# Patient Record
Sex: Male | Born: 1985 | Race: Black or African American | Hispanic: No | Marital: Single | Smoking: Never smoker
Health system: Southern US, Community
[De-identification: ages and names within clinical notes are randomized; demographics above are authoritative.]

## PROBLEM LIST (undated history)

## (undated) HISTORY — PX: HERNIA REPAIR: SHX51

---

## 2019-08-15 ENCOUNTER — Other Ambulatory Visit: Payer: Self-pay

## 2019-08-15 DIAGNOSIS — Z20822 Contact with and (suspected) exposure to covid-19: Secondary | ICD-10-CM

## 2019-08-16 LAB — NOVEL CORONAVIRUS, NAA: SARS-CoV-2, NAA: NOT DETECTED

## 2020-12-02 ENCOUNTER — Emergency Department (HOSPITAL_COMMUNITY)
Admission: EM | Admit: 2020-12-02 | Discharge: 2020-12-02 | Disposition: A | Discharge status: Home / Self Care | Payer: Self-pay | Attending: Emergency Medicine | Admitting: Emergency Medicine

## 2020-12-02 ENCOUNTER — Other Ambulatory Visit: Payer: Self-pay

## 2020-12-02 ENCOUNTER — Encounter (HOSPITAL_COMMUNITY): Payer: Self-pay

## 2020-12-02 ENCOUNTER — Emergency Department (HOSPITAL_COMMUNITY): Payer: Self-pay

## 2020-12-02 DIAGNOSIS — M65272 Calcific tendinitis, left ankle and foot: Secondary | ICD-10-CM | POA: Insufficient documentation

## 2020-12-02 DIAGNOSIS — L84 Corns and callosities: Secondary | ICD-10-CM | POA: Insufficient documentation

## 2020-12-02 DIAGNOSIS — M7750 Other enthesopathy of unspecified foot: Secondary | ICD-10-CM

## 2020-12-02 DIAGNOSIS — M79675 Pain in left toe(s): Secondary | ICD-10-CM

## 2020-12-02 DIAGNOSIS — F17290 Nicotine dependence, other tobacco product, uncomplicated: Secondary | ICD-10-CM | POA: Insufficient documentation

## 2020-12-02 MED ORDER — PREDNISONE 20 MG PO TABS: 20.0000 mg | ORAL_TABLET | Freq: Two times a day (BID) | ORAL | 0 refills | Status: AC

## 2020-12-02 NOTE — ED Provider Notes (Signed)
[facility]-EMERGENCY DEPT Provider Note   CSN: [id number] Arrival date & time: 12/02/20  1010     History Chief Complaint  Patient presents with  . Toe Pain    Kenneth Stewart is a 35 y.o. male.  HPI He presents for evaluation of pain in left foot, primarily the big toe, for 2 days, without specific known trauma.  He works as a Tree surgeon, cleaning homes.  He wears boots frequently.  No chronic history of foot pain.  There are no other known modifying factors    History reviewed. No pertinent past medical history.  There are no problems to display for this patient.   Past Surgical History:  Procedure Laterality Date  . HERNIA REPAIR         Family History  Problem Relation Age of Onset  . Hypertension Father   . Diabetes Father     Social History   Tobacco Use  . Smoking status: Never Smoker  . Smokeless tobacco: Current User    Types: Snuff  Vaping Use  . Vaping Use: Never used  Substance Use Topics  . Alcohol use: Yes  . Drug use: Yes    Types: Marijuana    Home Medications Prior to Admission medications   Not on File    Allergies    Patient has no known allergies.  Review of Systems   Review of Systems  All other systems reviewed and are negative.   Physical Exam Updated Vital Signs BP (!) 148/76 (BP Location: Left Arm)   Pulse 69   Temp 98.4 F (36.9 C) (Oral)   Resp 16   Ht 6\' 5"  (1.956 m)   Wt 106.6 kg   SpO2 98%   BMI 27.87 kg/m   Physical Exam Vitals and nursing note reviewed.  Constitutional:      General: He is not in acute distress.    Appearance: He is well-developed. He is not ill-appearing, toxic-appearing or diaphoretic.  HENT:     Head: Normocephalic and atraumatic.     Right Ear: External ear normal.     Left Ear: External ear normal.     Nose: No congestion.  Eyes:     Conjunctiva/sclera: Conjunctivae normal.     Pupils: Pupils are equal, round, and reactive to light.   Neck:     Trachea: Phonation normal.  Cardiovascular:     Rate and Rhythm: Normal rate.  Pulmonary:     Effort: Pulmonary effort is normal.  Abdominal:     General: There is no distension.     Tenderness: There is no abdominal tenderness.  Musculoskeletal:     Cervical back: Normal range of motion and neck supple.     Comments: Left foot, mild tenderness localized to the first MTP region.  Area of maximal tenderness and discomfort is dorsally, extending along the first proximal phalanx.  There is minimal induration of this area but no erythema.  He resists extension of the great toe secondary to pain indicating a possible tendinopathy.  There is no tenderness of the dorsal midfoot or ankle on the left.  Skin:    General: Skin is warm and dry.     Comments: He has calluses on the dorsum of the toes of left foot, primarily second and third.  This tends to indicate tight feeling/constricting footwear.  Neurological:     Mental Status: He is alert and oriented to person, place, and time.     Cranial Nerves: No cranial  nerve deficit.     Sensory: No sensory deficit.     Motor: No abnormal muscle tone.     Coordination: Coordination normal.  Psychiatric:        Mood and Affect: Mood normal.        Behavior: Behavior normal.        Thought Content: Thought content normal.        Judgment: Judgment normal.     ED Results / Procedures / Treatments   Labs (all labs ordered are listed, but only abnormal results are displayed) Labs Reviewed - No data to display  EKG None  Radiology DG Foot Complete Left  Result Date: 12/02/2020 CLINICAL DATA:  Great toe pain and swelling x2 days EXAM: LEFT FOOT - COMPLETE 3+ VIEW COMPARISON:  None. FINDINGS: There is no evidence of fracture or dislocation. There is no evidence of arthropathy. Cortical irregularities of the tarsal bones on the dorsal midfoot, favor sequela of prior trauma versus congenital irregularity none. Calcaneal enthesophytes. Soft  tissues are unremarkable. IMPRESSION: 1. No acute fracture or dislocation. Electronically Signed   By: Maudry Mayhew MD   On: 12/02/2020 10:51    Procedures Procedures   Medications Ordered in ED Medications - No data to display  ED Course  I have reviewed the triage vital signs and the nursing notes.  Pertinent labs & imaging results that were available during my care of the patient were reviewed by me and considered in my medical decision making (see chart for details).    MDM Rules/Calculators/A&P                           Patient Vitals for the past 24 hrs:  BP Temp Temp src Pulse Resp SpO2 Height Weight  12/02/20 1018 -- -- -- -- -- -- 6\' 5"  (1.956 m) 106.6 kg  12/02/20 1017 (!) 148/76 98.4 F (36.9 C) Oral 69 16 98 % -- --    10:55 AM Reevaluation with update and discussion. After initial assessment and treatment, an updated evaluation reveals no change in clinical status, findings cussed with patient all questions were answered. 12/04/20   Medical Decision Making:  This patient is presenting for evaluation of foot pain, which does require a range of treatment options, and is a complaint that involves a moderate risk of morbidity and mortality. The differential diagnoses include occult fracture, infection, arthropathy, tendinopathy. I decided to review old records, and in summary Young middle-aged male presenting for foot and toe pain, onset without trauma.  I do not require additional historical information from anyone.   Radiologic Tests Ordered, included left foot.  I independently Visualized: Radiographic images, which show no fracture, dislocation, joint abnormality      Critical Interventions-clinical evaluation, radiography, observation and reassessment  After These Interventions, the Patient was reevaluated and was found stable for discharge.  Evaluation most consistent with dorsal tendinitis of the first MTP region.  No fracture, arthropathy, gout,  degenerative changes  CRITICAL CARE-no Performed by: Mancel Bale  Nursing Notes Reviewed/ Care Coordinated Applicable Imaging Reviewed Interpretation of Laboratory Data incorporated into ED treatment  The patient appears reasonably screened and/or stabilized for discharge and I doubt any other medical condition or other [facility] requiring further screening, evaluation, or treatment in the ED at this time prior to discharge.  Plan: Home Medications-Tylenol for pain; Home Treatments-heat to affected area, firm but none constricting footwear; return here if the recommended treatment, does not improve  the symptoms; Recommended follow up-podiatry, or foot orthopedics, for further management     Final Clinical Impression(s) / ED Diagnoses Final diagnoses:  Great toe pain, left  Tendinitis of ankle or foot    Rx / DC Orders ED Discharge Orders    None       Mancel Bale, MD 12/02/20 1106

## 2020-12-02 NOTE — Discharge Instructions (Signed)
The pain you are having in your foot and toe, is most likely tendinitis.  To help this make sure you are wearing firm footwear and a large toe box, to prevent constriction on the big toe.  Also using heat on the sore area 2 or 3 times a day can be helpful.  We are prescribing prednisone to treat inflammation which should help after a few days.  Follow-up with doctor of your choice if not better after this treatment.

## 2020-12-02 NOTE — ED Triage Notes (Signed)
Patient c/o left big toe pain and swelling x 2 days.

## 2022-01-27 ENCOUNTER — Encounter (HOSPITAL_COMMUNITY): Payer: Self-pay

## 2022-01-27 ENCOUNTER — Emergency Department (HOSPITAL_COMMUNITY)
Admission: EM | Admit: 2022-01-27 | Discharge: 2022-01-27 | Disposition: A | Discharge status: Home / Self Care | Payer: Self-pay | Attending: Emergency Medicine | Admitting: Emergency Medicine

## 2022-01-27 ENCOUNTER — Other Ambulatory Visit: Payer: Self-pay

## 2022-01-27 ENCOUNTER — Emergency Department (HOSPITAL_COMMUNITY): Payer: Self-pay

## 2022-01-27 DIAGNOSIS — R1030 Lower abdominal pain, unspecified: Secondary | ICD-10-CM | POA: Insufficient documentation

## 2022-01-27 DIAGNOSIS — X500XXA Overexertion from strenuous movement or load, initial encounter: Secondary | ICD-10-CM | POA: Insufficient documentation

## 2022-01-27 DIAGNOSIS — R1032 Left lower quadrant pain: Secondary | ICD-10-CM

## 2022-01-27 DIAGNOSIS — Y990 Civilian activity done for income or pay: Secondary | ICD-10-CM | POA: Insufficient documentation

## 2022-01-27 MED ORDER — NAPROXEN 500 MG PO TABS: 500.0000 mg | ORAL_TABLET | Freq: Two times a day (BID) | ORAL | 0 refills | Status: AC

## 2022-01-27 MED ORDER — KETOROLAC TROMETHAMINE 30 MG/ML IJ SOLN
30.0000 mg | Freq: Once | INTRAMUSCULAR | Status: AC
  Administered 2022-01-27: 30 mg via INTRAMUSCULAR
  Filled 2022-01-27: qty 1

## 2022-01-27 NOTE — ED Provider Notes (Signed)
?[facility] EMERGENCY DEPARTMENT ?Provider Note ? ? ?CSN: [id number] ?Arrival date & time: 01/27/22  0144 ? ?  ? ?History ? ?Chief Complaint  ?Patient presents with  ? Leg Pain  ? ? ?Kenneth Stewart is a 36 y.o. male. ? ?HPI ? ?  ? ?This is a 36 year old male who presents with left groin pain.  Patient reports onset of symptoms after being at work several days ago.  He states he did do some heavy lifting.  He has a history of right right inguinal hernia requiring repair and states that this feels similar.  Pain is mostly in the left groin and it radiates down into his thigh.  He has not noted any masses or bulges.  No scrotal changes.  Denies any nausea or vomiting.  Does report some abdominal pain when he sits up quickly. ? ?Home Medications ?Prior to Admission medications   ?Medication Sig Start Date End Date Taking? Authorizing Provider  ?naproxen (NAPROSYN) 500 MG tablet Take 1 tablet (500 mg total) by mouth 2 (two) times daily. 01/27/22  Yes Aaniyah Strohm, Mayer Masker, MD  ?predniSONE (DELTASONE) 20 MG tablet Take 1 tablet (20 mg total) by mouth 2 (two) times daily. 12/02/20   Mancel Bale, MD  ?   ? ?Allergies    ?Patient has no known allergies.   ? ?Review of Systems   ?Review of Systems  ?Constitutional:  Negative for fever.  ?Gastrointestinal:  Positive for abdominal pain.  ?Genitourinary:  Negative for scrotal swelling.  ?All other systems reviewed and are negative. ? ?Physical Exam ?Updated Vital Signs ?BP 127/79   Pulse (!) 55   Temp 98.6 ?F (37 ?C) (Oral)   Resp 18   Ht 1.93 m (6\' 4" )   Wt 113.4 kg   SpO2 99%   BMI 30.43 kg/m?  ?Physical Exam ?Vitals and nursing note reviewed.  ?Constitutional:   ?   Appearance: He is well-developed. He is not ill-appearing.  ?HENT:  ?   Head: Normocephalic and atraumatic.  ?Eyes:  ?   Pupils: Pupils are equal, round, and reactive to light.  ?Cardiovascular:  ?   Rate and Rhythm: Normal rate and regular rhythm.  ?Pulmonary:  ?   Effort: Pulmonary effort is  normal. No respiratory distress.  ?Abdominal:  ?   General: Bowel sounds are normal.  ?   Palpations: Abdomen is soft.  ?   Tenderness: There is no abdominal tenderness. There is no rebound.  ?Genitourinary: ?   Comments: Tenderness palpation left inguinal region, no masses palpated, no scrotal masses ?Musculoskeletal:  ?   Cervical back: Neck supple.  ?Lymphadenopathy:  ?   Cervical: No cervical adenopathy.  ?Skin: ?   General: Skin is warm and dry.  ?Neurological:  ?   Mental Status: He is alert and oriented to person, place, and time.  ?Psychiatric:     ?   Mood and Affect: Mood normal.  ? ? ?ED Results / Procedures / Treatments   ?Labs ?(all labs ordered are listed, but only abnormal results are displayed) ?Labs Reviewed - No data to display ? ?EKG ?None ? ?Radiology ? Pelvis Limited ? ?Result Date: 01/27/2022 ?CLINICAL DATA:  Left groin pain, concern for inguinal hernia. EXAM: 03/29/2022 PELVIS LIMITED TECHNIQUE: [place] scale and color-flow images were obtained in the region of pain in the left groin. COMPARISON:  None Available. FINDINGS: No inguinal hernias identified. A few prominent lymph nodes are present in the left groin measuring 1.2 x 1.0 x  1.2 cm and 0.9 x 0.5 x 1.0 cm. IMPRESSION: 1. No evidence of inguinal hernia. 2. Nonspecific prominent lymph nodes in the left groin. Electronically Signed   By: Thornell Sartorius M.D.   On: 01/27/2022 04:22   ? ?Procedures ?Procedures  ? ? ?Medications Ordered in ED ?Medications  ?ketorolac (TORADOL) 30 MG/ML injection 30 mg (30 mg Intramuscular Given 01/27/22 0330)  ? ? ?ED Course/ Medical Decision Making/ A&P ?  ?                        ?Medical Decision Making ?Amount and/or Complexity of Data Reviewed ?Radiology: ordered. ? ?Risk ?Prescription drug management. ? ? ?This patient presents to the ED for concern of left groin pain, this involves an extensive number of treatment options, and is a complaint that carries with it a high risk of complications and morbidity.  The  differential diagnosis includes injury, occult hernia, lymphadenopathy ? ?MDM:   ? ?This is a 36 year old male who presents with left groin pain.  Reports after heavy lifting.  Compares it to his prior right inguinal hernia.  He is nontoxic and vital signs are reassuring.  No palpable hernia on exam.  He does have some tenderness in the left groin.  Ultrasound obtained to evaluate for occult hernia.  Ultrasound is negative.  There is some lymphadenopathy which is nonspecific.  On repeat evaluation, patient denies any concerns for infection including STDs.  No new sexual contacts or penile discharge.  Will treat conservatively with anti-inflammatories for possible pulled muscle.  Patient states he had significant improvement with Toradol. ?Admission considered for groin pain ?(Labs, imaging, consults) ? ?Labs: ?I Ordered, and personally interpreted labs.  The pertinent results include: None ? ?Imaging Studies ordered: ?I ordered imaging studies including ultrasound groin ?I independently visualized and interpreted imaging. ?I agree with the radiologist interpretation ? ?Additional history obtained from chart review.  External records from outside source obtained and reviewed including prior visits ? ?Cardiac Monitoring: ?The patient was maintained on a cardiac monitor.  I personally viewed and interpreted the cardiac monitored which showed an underlying rhythm of: Sinus rhythm ? ?Reevaluation: ?After the interventions noted above, I reevaluated the patient and found that they have :improved ? ?Social Determinants of Health: ?Lives independently ? ?Disposition: Discharge ? ?Co morbidities that complicate the patient evaluation ?History reviewed. No pertinent past medical history.  ? ?Medicines ?Meds ordered this encounter  ?Medications  ? ketorolac (TORADOL) 30 MG/ML injection 30 mg  ? naproxen (NAPROSYN) 500 MG tablet  ?  Sig: Take 1 tablet (500 mg total) by mouth 2 (two) times daily.  ?  Dispense:  30 tablet  ?   Refill:  0  ?  ?I have reviewed the patients home medicines and have made adjustments as needed ? ?Problem List / ED Course: ?Problem List Items Addressed This Visit   ?None ?Visit Diagnoses   ? ? Left groin pain    -  Primary  ? ?  ?  ? ? ? ? ? ? ? ? ? ? ? ? ?Final Clinical Impression(s) / ED Diagnoses ?Final diagnoses:  ?Left groin pain  ? ? ?Rx / DC Orders ?ED Discharge Orders   ? ?      Ordered  ?  naproxen (NAPROSYN) 500 MG tablet  2 times daily       ? 01/27/22 0430  ? ?  ?  ? ?  ? ? ?  ?Aharon Carriere, Mayer Masker,  MD ?01/27/22 0433 ? ?

## 2022-01-27 NOTE — Discharge Instructions (Signed)
You were seen today for groin pain.  You may have pulled a muscle.  There is no evidence of hernia on ultrasound.  Take naproxen as needed for pain.  Apply ice. ?

## 2022-01-27 NOTE — ED Triage Notes (Signed)
Pt believes he may have pulled groin while at work a couple of days ago. Pain is in left groin and shoots down left leg. Pt ambulatory. ?

## 2022-07-05 IMAGING — DX DG FOOT COMPLETE 3+V*L*
3 series · 3 of 3 positions shown · non-contrast
Comparison: None.

CLINICAL DATA: Great toe pain and swelling x2 days

EXAM:
LEFT FOOT - COMPLETE 3+ VIEW

[foot ap]
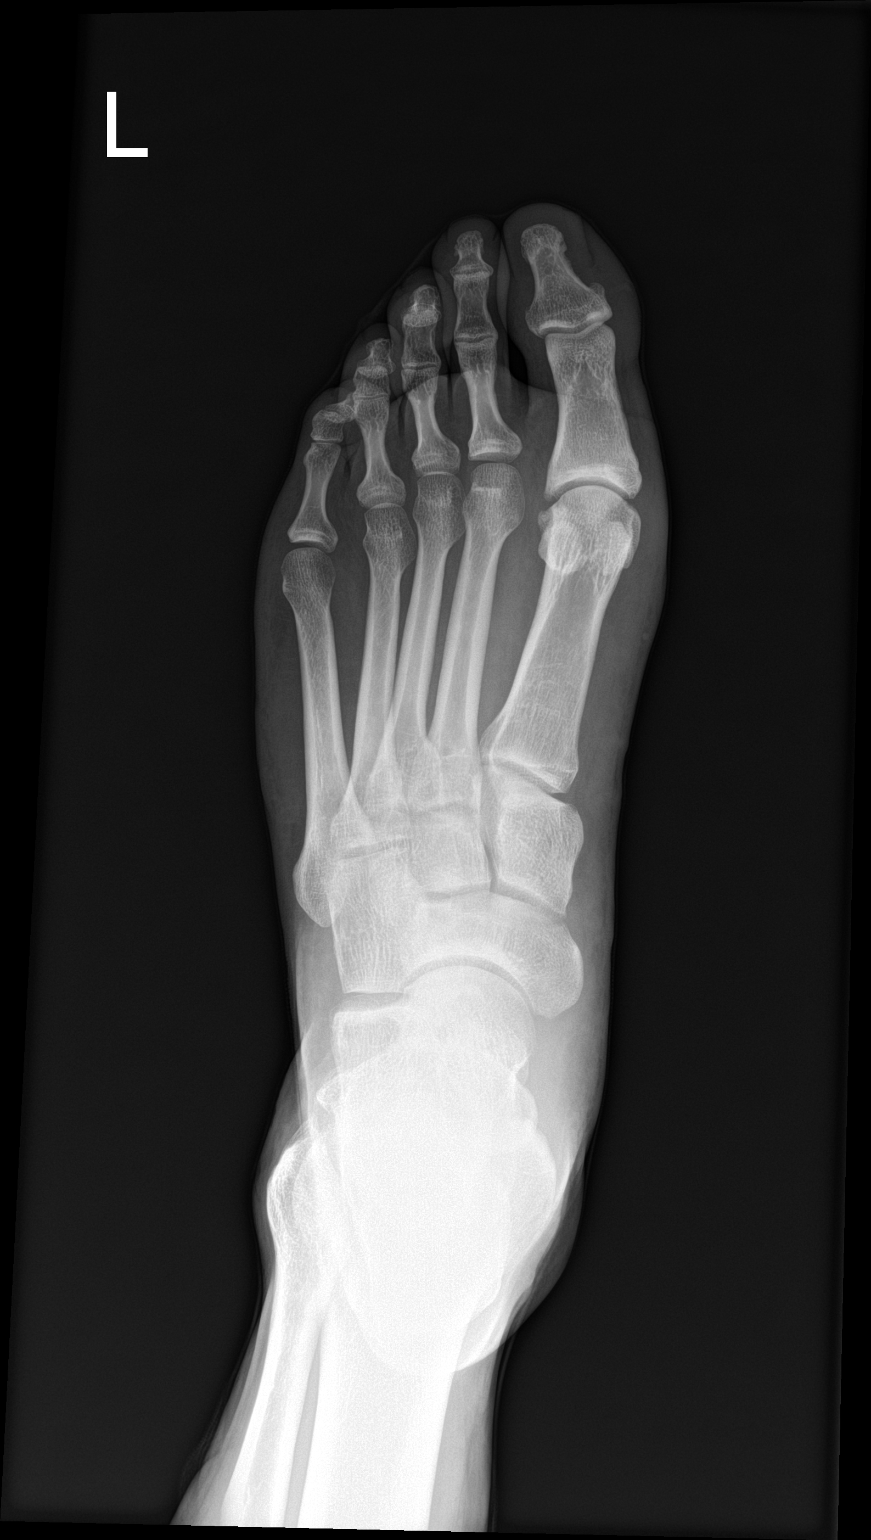

[foot obl]
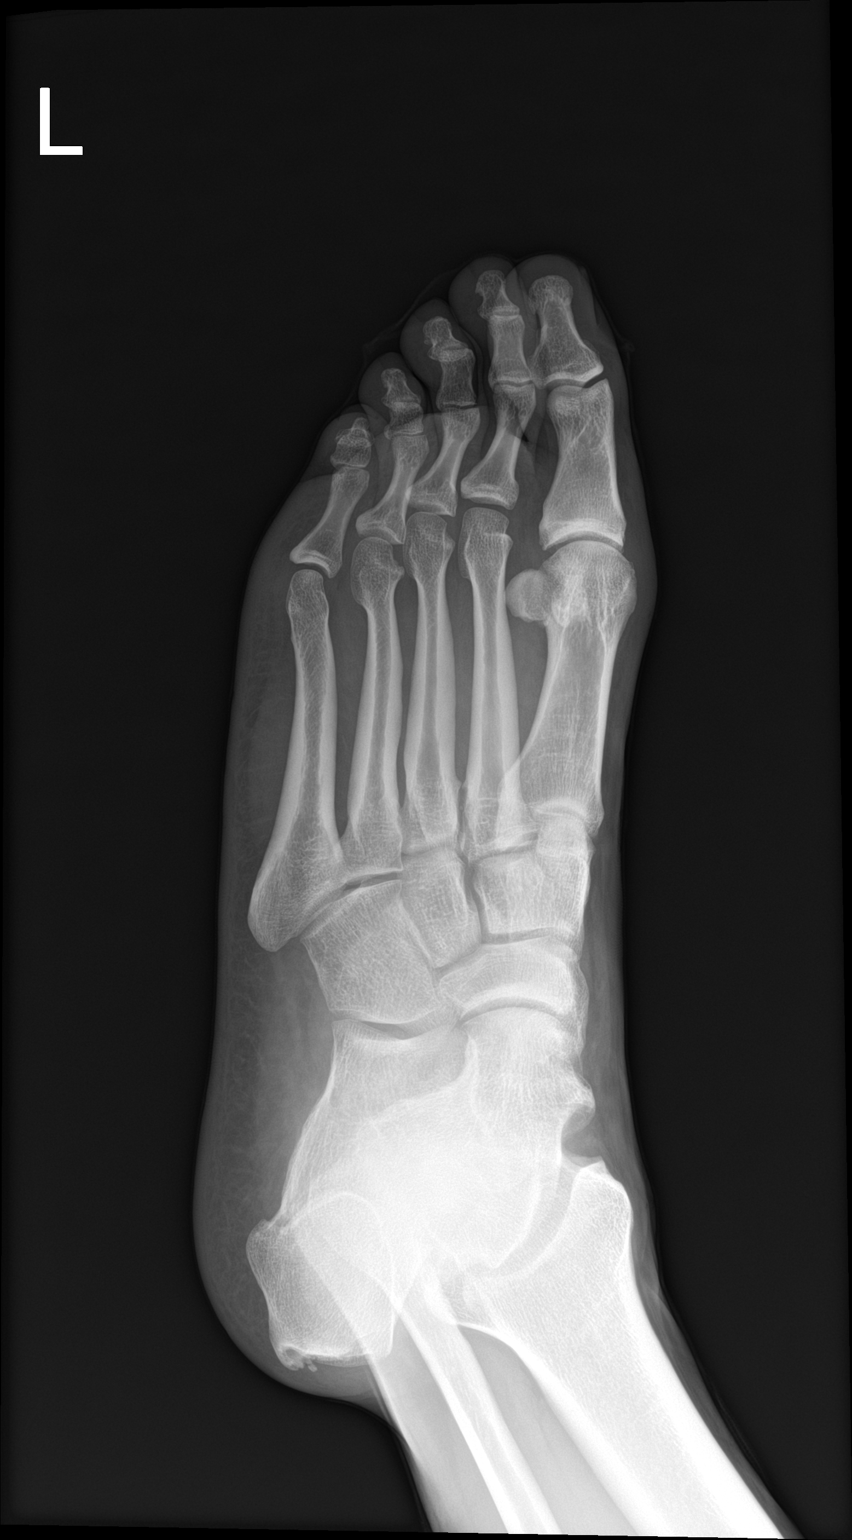

[foot lat]
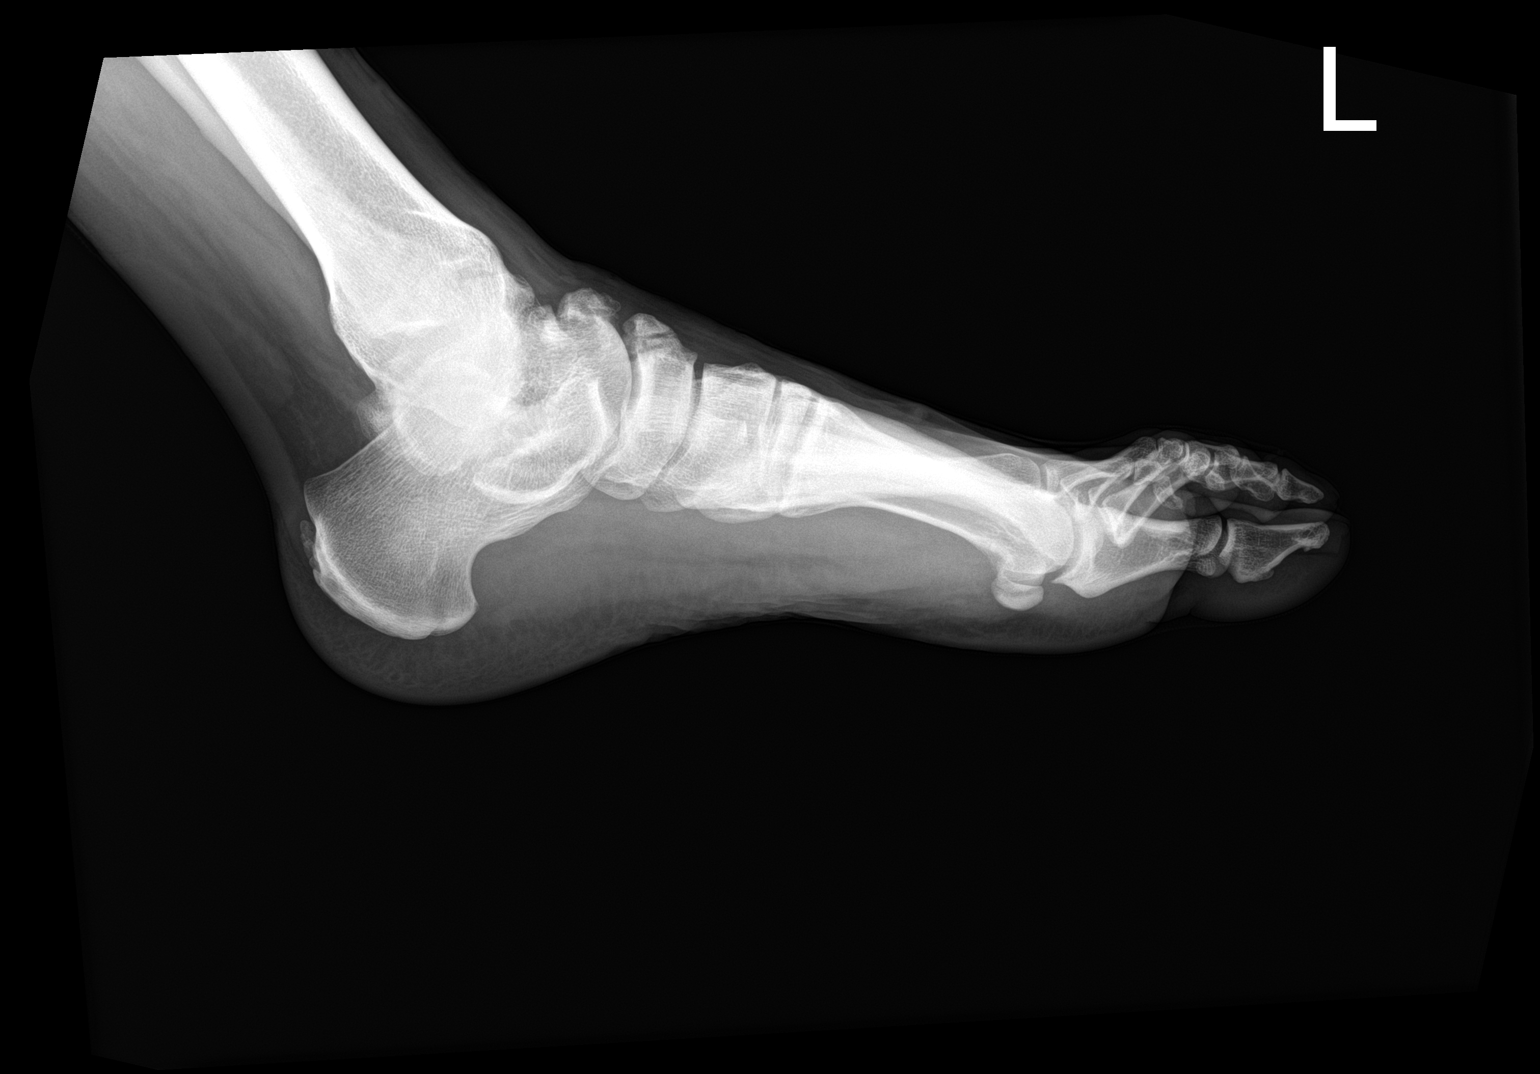

[3 of 3 positions shown; findings below may reference images not displayed]

FINDINGS: There is no evidence of fracture or dislocation. There is no
evidence of arthropathy. Cortical irregularities of the tarsal bones
on the dorsal midfoot, favor sequela of prior trauma versus
congenital irregularity none. Calcaneal enthesophytes. Soft tissues
are unremarkable.
IMPRESSION: 1. No acute fracture or dislocation.

## 2023-08-30 IMAGING — US US PELVIS LIMITED
1 series · 14 of 15 positions shown · non-contrast
Comparison: None Available.

CLINICAL DATA: Left groin pain, concern for inguinal hernia.

EXAM:
US PELVIS LIMITED
TECHNIQUE: Gray scale and color-flow images were obtained in the region of pain
in the left groin.

[Series 1: us pelvis limited (transabdominal only) · 15 acquisitions, 14 frames shown]
[im 1/15]
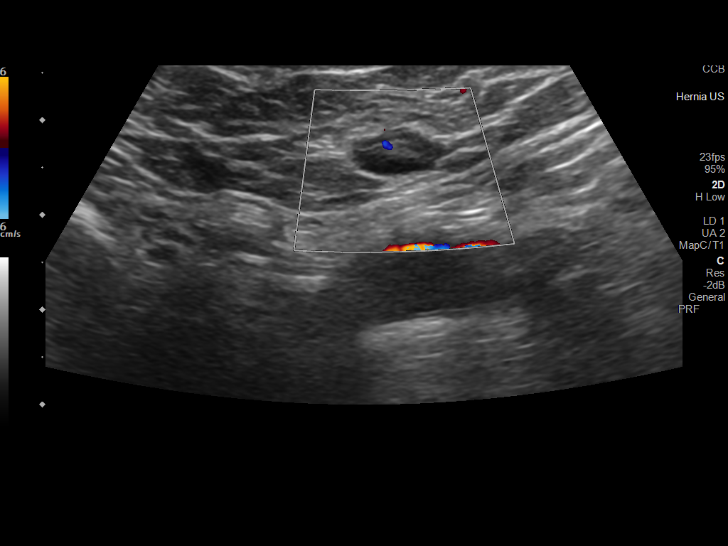
[im 2/15]
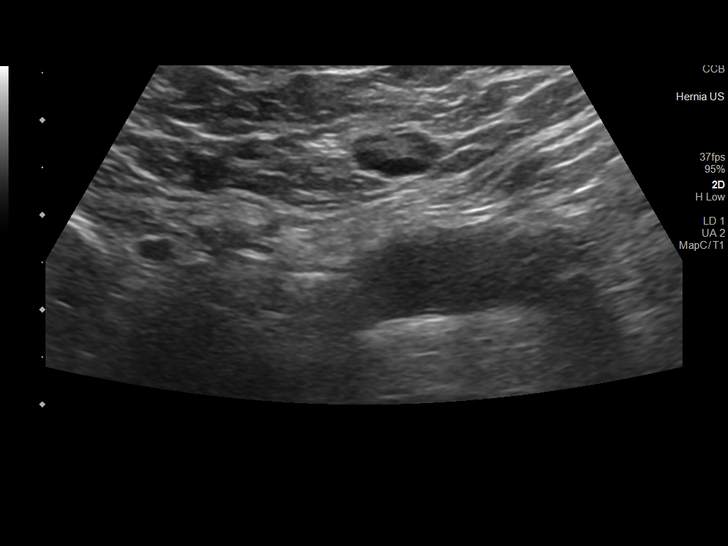
[im 3/15]
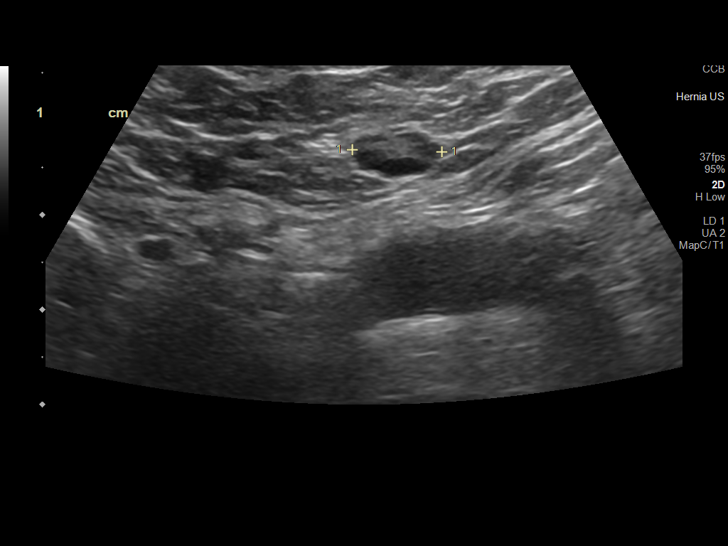
[im 4/15]
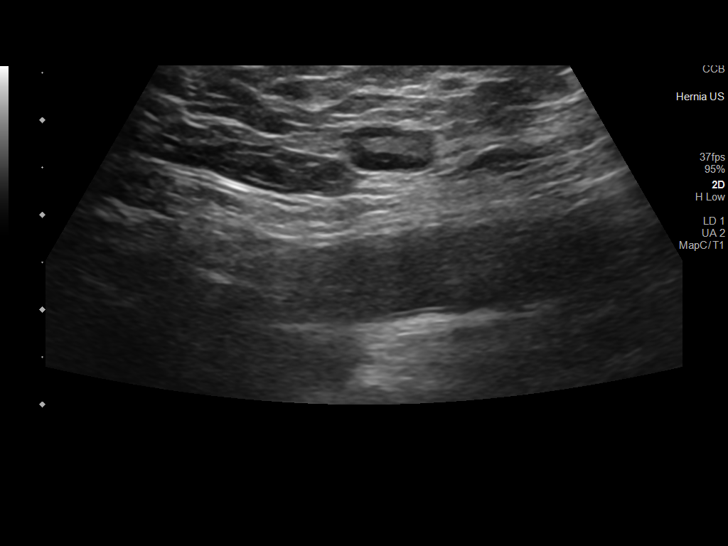
[im 5/15]
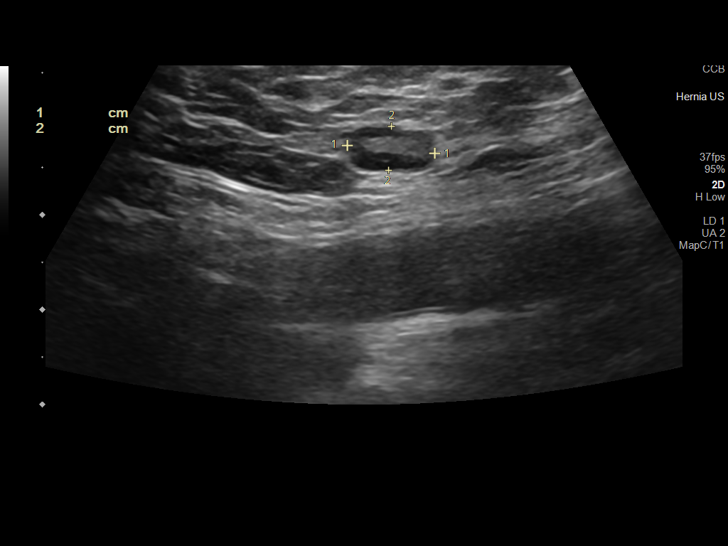
[im 6/15]
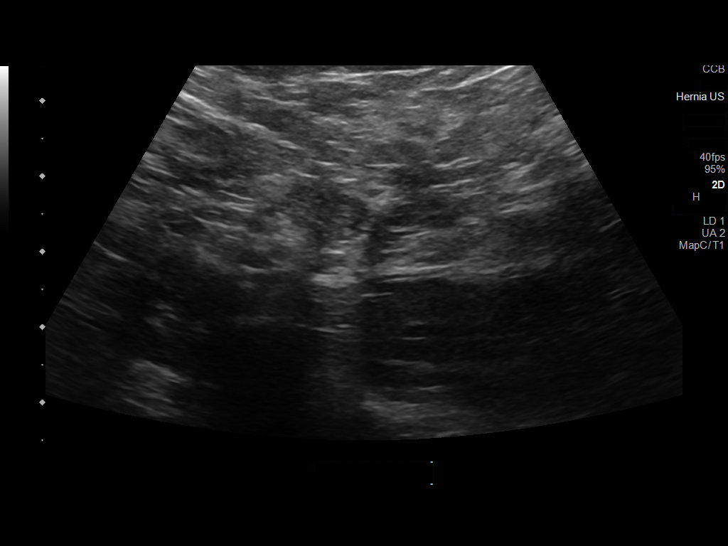
[im 7/15]
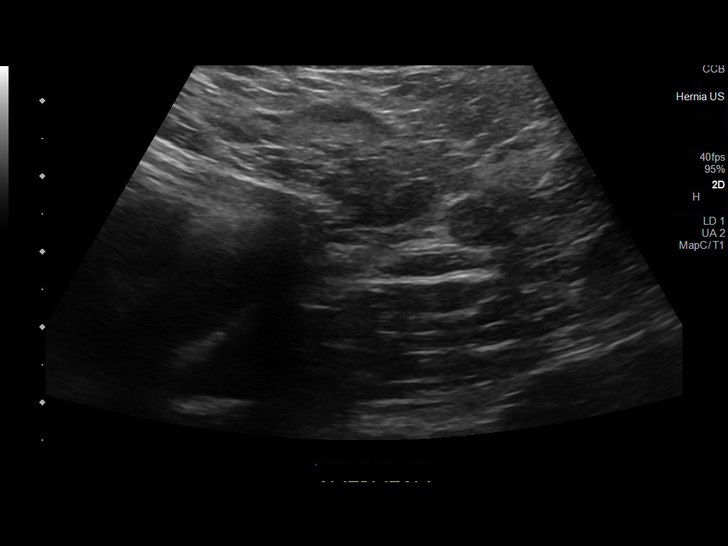
[im 9/15]
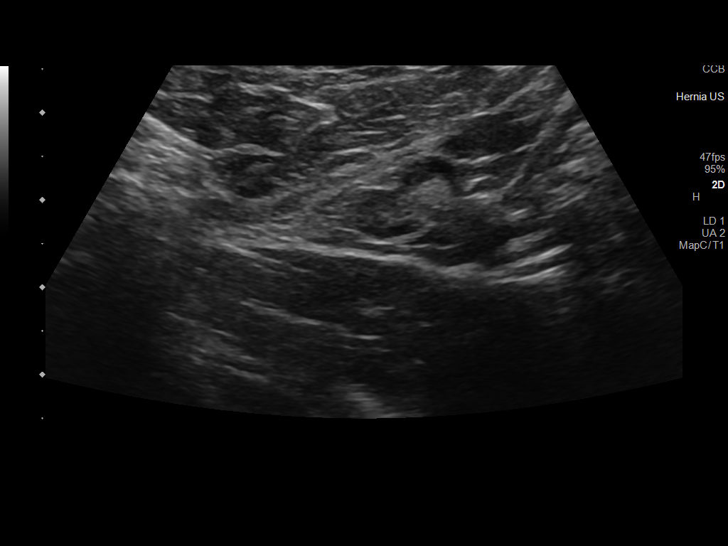
[im 10/15]
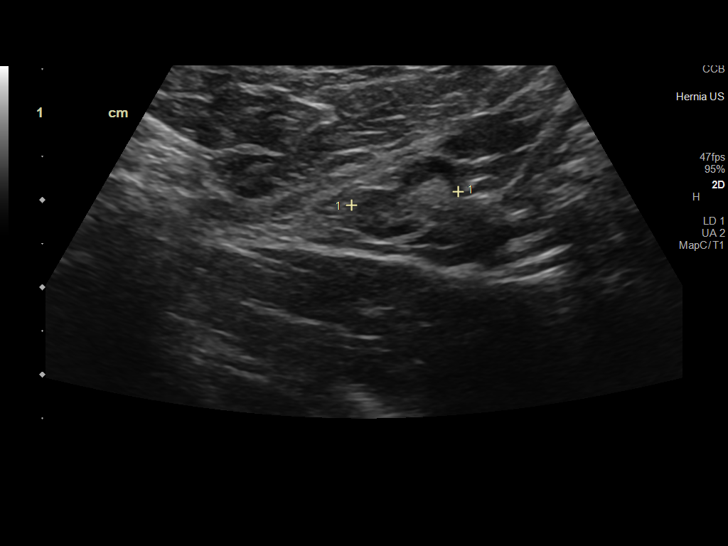
[im 11/15]
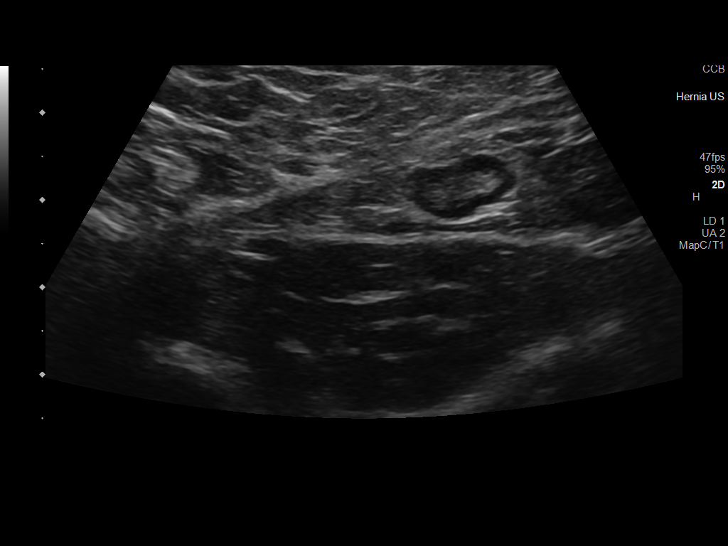
[im 12/15]
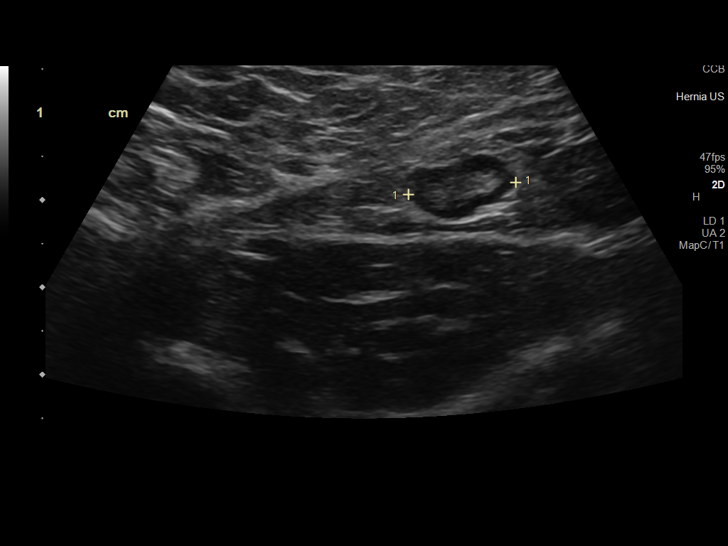
[im 13/15]
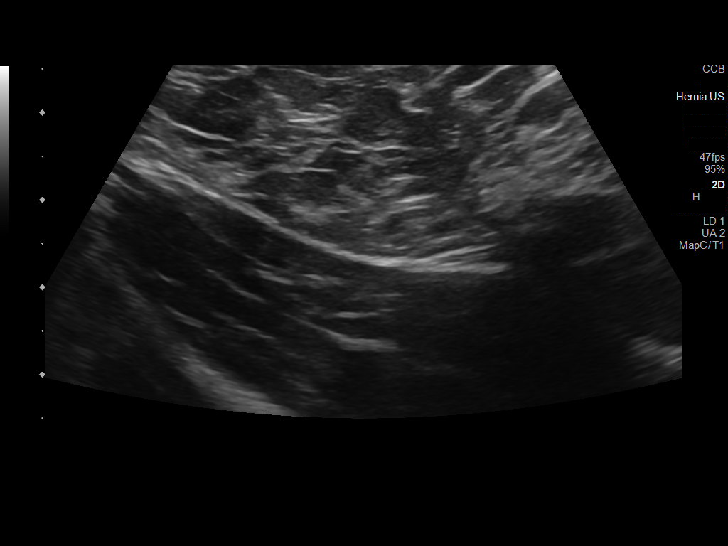
[im 14/15]
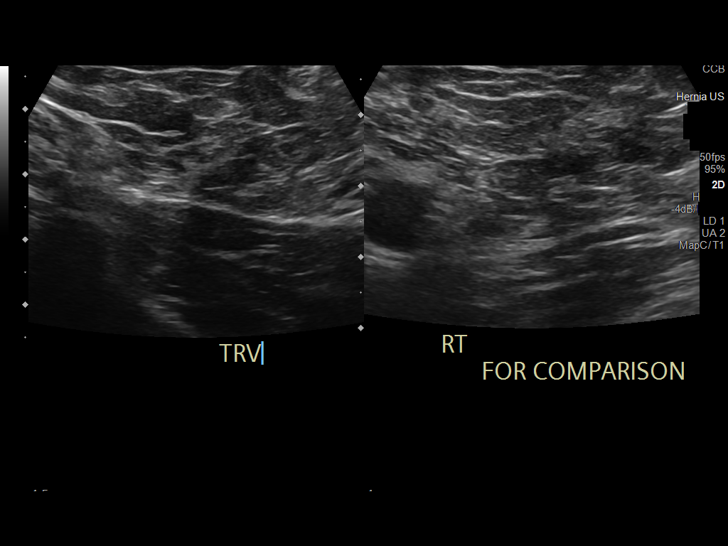
[im 15/15]
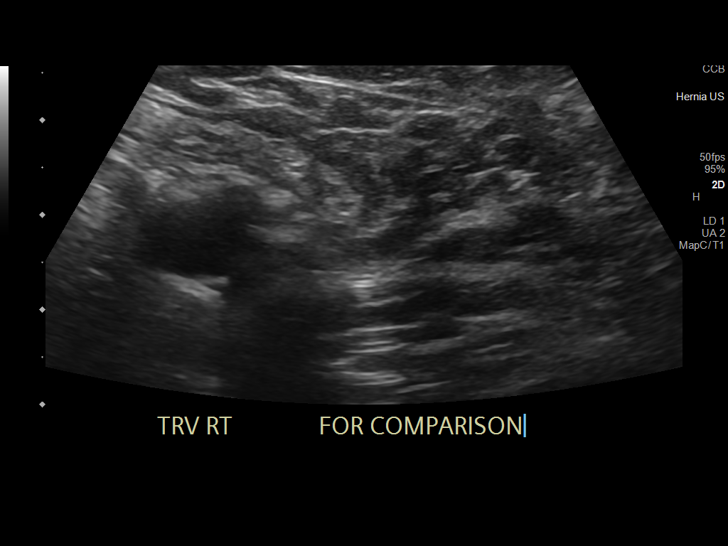

[14 of 15 positions shown; findings below may reference images not displayed]

FINDINGS: No inguinal hernias identified. A few prominent lymph nodes are
present in the left groin measuring 1.2 x 1.0 x 1.2 cm and 0.9 x
x 1.0 cm.
IMPRESSION: 1. No evidence of inguinal hernia.
2. Nonspecific prominent lymph nodes in the left groin.
